# Patient Record
Sex: Female | Born: 1940 | Race: White | Hispanic: No | Smoking: Never smoker
Health system: Southern US, Community
[De-identification: ages and names within clinical notes are randomized; demographics above are authoritative.]

## PROBLEM LIST (undated history)

## (undated) DIAGNOSIS — R011 Cardiac murmur, unspecified: Secondary | ICD-10-CM

## (undated) DIAGNOSIS — E119 Type 2 diabetes mellitus without complications: Secondary | ICD-10-CM

## (undated) DIAGNOSIS — R0600 Dyspnea, unspecified: Secondary | ICD-10-CM

## (undated) DIAGNOSIS — I499 Cardiac arrhythmia, unspecified: Secondary | ICD-10-CM

## (undated) DIAGNOSIS — Z87442 Personal history of urinary calculi: Secondary | ICD-10-CM

## (undated) DIAGNOSIS — I10 Essential (primary) hypertension: Secondary | ICD-10-CM

## (undated) DIAGNOSIS — M1990 Unspecified osteoarthritis, unspecified site: Secondary | ICD-10-CM

## (undated) DIAGNOSIS — I4891 Unspecified atrial fibrillation: Secondary | ICD-10-CM

## (undated) DIAGNOSIS — I509 Heart failure, unspecified: Secondary | ICD-10-CM

## (undated) HISTORY — PX: EYE SURGERY: SHX253

## (undated) HISTORY — PX: MASTECTOMY: SHX3

## (undated) HISTORY — PX: ABDOMINAL HYSTERECTOMY: SHX81

## (undated) HISTORY — PX: TONSILLECTOMY: SUR1361

## (undated) HISTORY — PX: BREAST SURGERY: SHX581

---

## 2000-08-10 ENCOUNTER — Emergency Department (HOSPITAL_COMMUNITY): Admission: EM | Admit: 2000-08-10 | Discharge: 2000-08-10 | Payer: Self-pay | Admitting: Emergency Medicine

## 2019-11-21 ENCOUNTER — Observation Stay (HOSPITAL_COMMUNITY)
Admission: EM | Admit: 2019-11-21 | Discharge: 2019-11-22 | Disposition: A | Discharge status: Home / Self Care | Payer: Medicare PPO | Attending: Surgery | Admitting: Surgery

## 2019-11-21 ENCOUNTER — Emergency Department (HOSPITAL_COMMUNITY): Payer: Medicare PPO

## 2019-11-21 ENCOUNTER — Encounter (HOSPITAL_COMMUNITY): Payer: Self-pay

## 2019-11-21 DIAGNOSIS — Z79899 Other long term (current) drug therapy: Secondary | ICD-10-CM | POA: Insufficient documentation

## 2019-11-21 DIAGNOSIS — E119 Type 2 diabetes mellitus without complications: Secondary | ICD-10-CM | POA: Insufficient documentation

## 2019-11-21 DIAGNOSIS — S2232XA Fracture of one rib, left side, initial encounter for closed fracture: Secondary | ICD-10-CM | POA: Diagnosis not present

## 2019-11-21 DIAGNOSIS — S2222XA Fracture of body of sternum, initial encounter for closed fracture: Principal | ICD-10-CM | POA: Insufficient documentation

## 2019-11-21 DIAGNOSIS — S2249XA Multiple fractures of ribs, unspecified side, initial encounter for closed fracture: Secondary | ICD-10-CM

## 2019-11-21 DIAGNOSIS — R2681 Unsteadiness on feet: Secondary | ICD-10-CM | POA: Diagnosis not present

## 2019-11-21 DIAGNOSIS — I4891 Unspecified atrial fibrillation: Secondary | ICD-10-CM | POA: Diagnosis not present

## 2019-11-21 DIAGNOSIS — I509 Heart failure, unspecified: Secondary | ICD-10-CM | POA: Diagnosis not present

## 2019-11-21 DIAGNOSIS — Z7901 Long term (current) use of anticoagulants: Secondary | ICD-10-CM | POA: Diagnosis not present

## 2019-11-21 DIAGNOSIS — Z7984 Long term (current) use of oral hypoglycemic drugs: Secondary | ICD-10-CM | POA: Insufficient documentation

## 2019-11-21 DIAGNOSIS — S2220XA Unspecified fracture of sternum, initial encounter for closed fracture: Secondary | ICD-10-CM | POA: Diagnosis present

## 2019-11-21 DIAGNOSIS — I110 Hypertensive heart disease with heart failure: Secondary | ICD-10-CM | POA: Diagnosis not present

## 2019-11-21 DIAGNOSIS — Y92410 Unspecified street and highway as the place of occurrence of the external cause: Secondary | ICD-10-CM | POA: Insufficient documentation

## 2019-11-21 DIAGNOSIS — K449 Diaphragmatic hernia without obstruction or gangrene: Secondary | ICD-10-CM | POA: Diagnosis not present

## 2019-11-21 DIAGNOSIS — Z20822 Contact with and (suspected) exposure to covid-19: Secondary | ICD-10-CM | POA: Diagnosis not present

## 2019-11-21 HISTORY — DX: Type 2 diabetes mellitus without complications: E11.9

## 2019-11-21 HISTORY — DX: Personal history of urinary calculi: Z87.442

## 2019-11-21 HISTORY — DX: Heart failure, unspecified: I50.9

## 2019-11-21 HISTORY — DX: Unspecified osteoarthritis, unspecified site: M19.90

## 2019-11-21 HISTORY — DX: Dyspnea, unspecified: R06.00

## 2019-11-21 HISTORY — DX: Cardiac murmur, unspecified: R01.1

## 2019-11-21 HISTORY — DX: Essential (primary) hypertension: I10

## 2019-11-21 HISTORY — DX: Unspecified atrial fibrillation: I48.91

## 2019-11-21 HISTORY — DX: Cardiac arrhythmia, unspecified: I49.9

## 2019-11-21 LAB — CBC
HCT: 42 % (ref 36.0–46.0)
Hemoglobin: 13.5 g/dL (ref 12.0–15.0)
MCH: 30.6 pg (ref 26.0–34.0)
MCHC: 32.1 g/dL (ref 30.0–36.0)
MCV: 95.2 fL (ref 80.0–100.0)
Platelets: 213 10*3/uL (ref 150–400)
RBC: 4.41 MIL/uL (ref 3.87–5.11)
RDW: 13.4 % (ref 11.5–15.5)
WBC: 7.3 10*3/uL (ref 4.0–10.5)
nRBC: 0 % (ref 0.0–0.2)

## 2019-11-21 LAB — BASIC METABOLIC PANEL
Anion gap: 14 (ref 5–15)
BUN: 24 mg/dL — ABNORMAL HIGH (ref 8–23)
CO2: 24 mmol/L (ref 22–32)
Calcium: 9.6 mg/dL (ref 8.9–10.3)
Chloride: 100 mmol/L (ref 98–111)
Creatinine, Ser: 1.2 mg/dL — ABNORMAL HIGH (ref 0.44–1.00)
GFR calc Af Amer: 49 mL/min — ABNORMAL LOW (ref >60)
GFR calc non Af Amer: 43 mL/min — ABNORMAL LOW (ref >60)
Glucose, Bld: 148 mg/dL — ABNORMAL HIGH (ref 70–99)
Potassium: 4.1 mmol/L (ref 3.5–5.1)
Sodium: 138 mmol/L (ref 135–145)

## 2019-11-21 MED ORDER — IOHEXOL 300 MG/ML  SOLN
80.0000 mL | Freq: Once | INTRAMUSCULAR | Status: AC | PRN
  Administered 2019-11-21: 80 mL via INTRAVENOUS

## 2019-11-21 MED ORDER — FENTANYL CITRATE (PF) 100 MCG/2ML IJ SOLN
50.0000 ug | Freq: Once | INTRAMUSCULAR | Status: AC
  Administered 2019-11-21: 50 ug via INTRAVENOUS
  Filled 2019-11-21: qty 2

## 2019-11-21 NOTE — ED Notes (Signed)
Son Link Snuffer), would like to be called with any updates. Number in chart
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
## 2019-11-22 ENCOUNTER — Encounter (HOSPITAL_COMMUNITY): Payer: Self-pay

## 2019-11-22 ENCOUNTER — Other Ambulatory Visit: Payer: Self-pay

## 2019-11-22 DIAGNOSIS — S2220XA Unspecified fracture of sternum, initial encounter for closed fracture: Secondary | ICD-10-CM | POA: Diagnosis present

## 2019-11-22 LAB — GLUCOSE, CAPILLARY
Glucose-Capillary: 147 mg/dL — ABNORMAL HIGH (ref 70–99)
Glucose-Capillary: 147 mg/dL — ABNORMAL HIGH (ref 70–99)
Glucose-Capillary: 166 mg/dL — ABNORMAL HIGH (ref 70–99)

## 2019-11-22 LAB — BASIC METABOLIC PANEL
Anion gap: 9 (ref 5–15)
BUN: 21 mg/dL (ref 8–23)
CO2: 26 mmol/L (ref 22–32)
Calcium: 8.8 mg/dL — ABNORMAL LOW (ref 8.9–10.3)
Chloride: 101 mmol/L (ref 98–111)
Creatinine, Ser: 1.07 mg/dL — ABNORMAL HIGH (ref 0.44–1.00)
GFR calc Af Amer: 57 mL/min — ABNORMAL LOW (ref >60)
GFR calc non Af Amer: 49 mL/min — ABNORMAL LOW (ref >60)
Glucose, Bld: 187 mg/dL — ABNORMAL HIGH (ref 70–99)
Potassium: 3.9 mmol/L (ref 3.5–5.1)
Sodium: 136 mmol/L (ref 135–145)

## 2019-11-22 LAB — CBC
HCT: 36 % (ref 36.0–46.0)
Hemoglobin: 11.8 g/dL — ABNORMAL LOW (ref 12.0–15.0)
MCH: 31.1 pg (ref 26.0–34.0)
MCHC: 32.8 g/dL (ref 30.0–36.0)
MCV: 95 fL (ref 80.0–100.0)
Platelets: 191 10*3/uL (ref 150–400)
RBC: 3.79 MIL/uL — ABNORMAL LOW (ref 3.87–5.11)
RDW: 13.4 % (ref 11.5–15.5)
WBC: 9.3 10*3/uL (ref 4.0–10.5)
nRBC: 0 % (ref 0.0–0.2)

## 2019-11-22 LAB — SARS CORONAVIRUS 2 BY RT PCR (HOSPITAL ORDER, PERFORMED IN ~~LOC~~ HOSPITAL LAB): SARS Coronavirus 2: NEGATIVE

## 2019-11-22 LAB — URINALYSIS, ROUTINE W REFLEX MICROSCOPIC
Bilirubin Urine: NEGATIVE
Glucose, UA: 50 mg/dL — AB
Ketones, ur: 20 mg/dL — AB
Nitrite: NEGATIVE
Protein, ur: 100 mg/dL — AB
Specific Gravity, Urine: 1.03 (ref 1.005–1.030)
WBC, UA: >50 WBC/hpf — ABNORMAL HIGH (ref 0–5)
pH: 6 (ref 5.0–8.0)

## 2019-11-22 LAB — PROCALCITONIN: Procalcitonin: <0.1 ng/mL

## 2019-11-22 LAB — HEMOGLOBIN A1C
Hgb A1c MFr Bld: 6.6 % — ABNORMAL HIGH (ref 4.8–5.6)
Mean Plasma Glucose: 142.72 mg/dL

## 2019-11-22 LAB — MAGNESIUM: Magnesium: 1.7 mg/dL (ref 1.7–2.4)

## 2019-11-22 MED ORDER — LINAGLIPTIN 5 MG PO TABS
5.0000 mg | ORAL_TABLET | Freq: Every day | ORAL | Status: DC
  Administered 2019-11-22: 5 mg via ORAL
  Filled 2019-11-22: qty 1

## 2019-11-22 MED ORDER — MORPHINE SULFATE (PF) 2 MG/ML IV SOLN: 2.0000 mg | INTRAVENOUS | Status: DC | PRN

## 2019-11-22 MED ORDER — ACETAMINOPHEN 325 MG PO TABS: 650.0000 mg | ORAL_TABLET | ORAL | Status: AC | PRN

## 2019-11-22 MED ORDER — ONDANSETRON HCL 4 MG/2ML IJ SOLN: 4.0000 mg | Freq: Four times a day (QID) | INTRAMUSCULAR | Status: DC | PRN

## 2019-11-22 MED ORDER — ACETAMINOPHEN 325 MG PO TABS: 650.0000 mg | ORAL_TABLET | ORAL | Status: DC | PRN

## 2019-11-22 MED ORDER — DEXTROSE-NACL 5-0.45 % IV SOLN: INTRAVENOUS | Status: DC

## 2019-11-22 MED ORDER — METOPROLOL TARTRATE 5 MG/5ML IV SOLN: 5.0000 mg | Freq: Four times a day (QID) | INTRAVENOUS | Status: DC | PRN

## 2019-11-22 MED ORDER — OXYCODONE HCL 5 MG PO TABS: 5.0000 mg | ORAL_TABLET | ORAL | Status: DC | PRN

## 2019-11-22 MED ORDER — SOTALOL HCL 80 MG PO TABS
80.0000 mg | ORAL_TABLET | Freq: Two times a day (BID) | ORAL | Status: DC
  Administered 2019-11-22 (×2): 80 mg via ORAL
  Filled 2019-11-22 (×4): qty 1

## 2019-11-22 MED ORDER — AMLODIPINE BESYLATE 5 MG PO TABS
5.0000 mg | ORAL_TABLET | Freq: Every day | ORAL | Status: DC
  Administered 2019-11-22: 5 mg via ORAL
  Filled 2019-11-22: qty 1

## 2019-11-22 MED ORDER — INSULIN ASPART 100 UNIT/ML ~~LOC~~ SOLN
0.0000 [IU] | SUBCUTANEOUS | Status: DC
  Administered 2019-11-22 (×2): 2 [IU] via SUBCUTANEOUS
  Administered 2019-11-22: 3 [IU] via SUBCUTANEOUS

## 2019-11-22 MED ORDER — FUROSEMIDE 40 MG PO TABS: 40.0000 mg | ORAL_TABLET | Freq: Every day | ORAL | Status: DC | PRN

## 2019-11-22 MED ORDER — OXYCODONE HCL 5 MG PO TABS
5.0000 mg | ORAL_TABLET | ORAL | Status: DC | PRN
  Administered 2019-11-22: 5 mg via ORAL
  Filled 2019-11-22: qty 1

## 2019-11-22 MED ORDER — OXYCODONE HCL 5 MG PO TABS: 5.0000 mg | ORAL_TABLET | Freq: Four times a day (QID) | ORAL | 0 refills | Status: AC | PRN

## 2019-11-22 MED ORDER — ONDANSETRON 4 MG PO TBDP: 4.0000 mg | ORAL_TABLET | Freq: Four times a day (QID) | ORAL | Status: DC | PRN
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
## 2019-11-22 NOTE — Care Management Obs Status (Signed)
MEDICARE OBSERVATION STATUS NOTIFICATION   Patient Details  Name: Kayla Farrell MRN: [id number] Date of Birth: 02-Mar-1941   Medicare Observation Status Notification Given:  Yes    Glennon Mac, RN 11/22/2019, 4:14 PM

## 2019-11-22 NOTE — Progress Notes (Signed)
DC summary reviewed at bedside with patient and son.  Both verbalized understanding and declined further teaching.  Patient discharged from unit via wheelchair and staff supervision.  Renda Rolls RN  11/22/19 @ [phone]
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
## 2019-11-22 NOTE — Plan of Care (Signed)
  Problem: Education: Goal: Knowledge of General Education information will improve Description Including pain rating scale, medication(s)/side effects and non-pharmacologic comfort measures Outcome: Progressing   
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
## 2019-11-24 LAB — URINE CULTURE: Culture: >=100000 — AB
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
## 2024-01-15 NOTE — Progress Notes (Signed)
 [address] - AMBULATORY [facility]  - [facility] [address] [place] [address]    Date of Service: 01/15/2024 Patient Name: Kayla Farrell Patient DOB: 07-Jan-1941    Assessment and Plan:  Zahli was seen today for hospital follow up.  Diagnoses and all orders for this visit:  Fall from standing, initial encounter Discussed fall prevention and she is more cautious at her house now.  Discussed using a cane and she will try to pick 1 up.  Right-sided low back pain without sciatica, unspecified chronicity Feeling some better still soreness.  Will send steroid pack to help with her symptoms.  CT secondary to prolonged pain and ruling out fracture. -     methylPREDNISolone (MEDROL DOSEPAK) 4 mg 6 day dose pack; Take As Directed On Package -     CT Spine Lumbar WO Contrast; Future    She verbalizes understanding and agreement of her current diagnoses, medications and therapies. All questions answered.   Medication side effects discussed with patient. Advised patient to call clinic or return for visit if these symptoms occur. The patient does not  have any barriers to taking medications as prescribed. Goals of care discussed with patient including medication adherence and adequate follow up Relevant barriers identified and addressed: none.   Results for orders placed or performed in visit on 11/17/23  Hemoglobin A1C With Estimated Average Glucose   Collection Time: 11/17/23  3:56 PM  Result Value Ref Range   Hemoglobin A1c 6.0 (H) <5.7 %   Estimated Average Glucose 126 mg/dL  TSH   Collection Time: 11/17/23  3:56 PM  Result Value Ref Range   TSH 1.914 0.450 - 5.330 uIU/mL  Albumin, Random Urine   Collection Time: 11/17/23  3:56 PM  Result Value Ref Range   Albumin, Urine 403 Not Established mg/L   Creatinine, Urine 159 >=20 mg/dL   Albumin/Creatinine Ratio, Urine 253 (H) 0 - 30 mg/g creat  CBC with  Differential   Collection Time: 11/17/23  3:56 PM  Result Value Ref Range   WBC 5.70 4.40 - 11.00 10*3/uL   RBC 3.77 (L) 4.10 - 5.10 10*6/uL   Hemoglobin 11.8 (L) 12.3 - 15.3 g/dL   Hematocrit 64.0 64.0 - 44.6 %   Mean Corpuscular Volume (MCV) 95.0 80.0 - 96.0 fL   Mean Corpuscular Hemoglobin (MCH) 31.2 27.5 - 33.2 pg   Mean Corpuscular Hemoglobin Conc (MCHC) 32.9 (L) 33.0 - 37.0 g/dL   Red Cell Distribution Width (RDW) 16.4 12.3 - 17.0 %   Platelet Count (PLT) 149 (L) 150 - 450 10*3/uL   Mean Platelet Volume (MPV) 10.2 6.8 - 10.2 fL   Neutrophils % 79 %   Lymphocytes % 9 %   Monocytes % 9 %   Eosinophils % 2 %   Basophils % 1 %   nRBC % 0 %   Neutrophils Absolute 4.50 1.80 - 7.80 10*3/uL   Lymphocytes # 0.50 (L) 1.00 - 4.80 10*3/uL   Monocytes # 0.50 0.00 - 0.80 10*3/uL   Eosinophils # 0.10 0.00 - 0.50 10*3/uL   Basophils # 0.00 0.00 - 0.20 10*3/uL   nRBC Absolute 0.00 <=0.00 10*3/uL   Return in about 3 weeks (around 02/05/2024) for fall follow up. Electronically signed by: Elsie Fairy Favorite, PA-C 01/15/2024 4:57 PM   HPI:   Kayla Farrell is a 83 y.o. female presents with hospital follow up (Pt was in the shower,  when she got out she felt tired and was headed to the bed to rest and the kind of woke up sitting in the floor . Low back pain, went to ER and had xrays which her negative. )    ROS:  Constitutional symptoms: negative Eyes:  negative Ear, nose, throat:  negative Cardiovascular:  negative Respiratory:  negative Gastrointestinal:  negative Genitourinary:  negative Skin:  negative Neurological:  negative Musculoskeletal:  negative Psychiatric:  negative Endocrine:  negative Hematological:  negative Allergic:  negative  Objective:   Vitals:   01/15/24 1601 01/15/24 1609  BP: 149/83 149/66  Pulse: 75 81  Resp: 14   Temp: 97.9 F (36.6 C)   TempSrc: Temporal   SpO2: 100% 100%  Weight: 63 kg (138 lb 12.8 oz)   Height: 1.676 m (5' 6)      Constitutional: Well-developed and well-nourished. Sitting comfortably conversing normally. Pleasant.  Cardiovascular: +S1S2, regular rate and rhythm, no murmurs, gallops or rubs appreciated.  Respiratory: Normal effort, clear to auscultation bilaterally. No wheezes, rales or rhonchi noted.  Back: No CVA tenderness bilaterally.  Mild tenderness palpation over the low back, midline Neuro: Alert and oriented x 3. Cranial nerves II-XII grossly intact.  Gait is guarded and slow  Allergies Meperidine, Cephalexin, Codeine, Latex, Nickel, Nitrofurantoin macrocrystalline, Sulfamethoxazole, and Trimethoprim  Current Medications[1] Problem List[2] Surgical History[3] Family History[4] Social History[5] Tobacco Use History[6]   I have reviewed and (if needed) updated the patient's problem list, medications, allergies, past medical and surgical history, social and family history. Health Maintenance Status       Date Due Completion Dates   Bone Density Scan Never done ---   Pneumococcal Vaccine for Ages 69+ (1 of 2 - PCV) Never done ---   ZOSTER VACCINE (2 of 2) 08/21/2023 06/26/2023   Influenza Vaccine (1) 12/18/2023 ---   Adult RSV (60+ Years or Pregnancy) (1 - 1-dose 75+ series) 11/16/2024 (Originally 12/24/2013) ---   DTaP/Tdap/Td Vaccines (1 - Tdap) 11/16/2024 (Originally 12/24/1959) ---   COVID-19 Vaccine (3 - 2024-25 season) 11/16/2024 (Originally 01/18/2023) 06/28/2019, 06/07/2019   Diabetes:  eGFR for Kidney Evaluation 11/15/2024 11/16/2023, 07/20/2023   Diabetes:  Quantitative uACR for Kidney Evaluation 11/16/2024 11/17/2023   Comprehensive Annual Visit 11/16/2024 11/17/2023, 11/17/2023   Medicare Annual Wellness (AWV) Subsequent Visits 11/16/2024 11/17/2023, 11/17/2023   Diabetes: Foot Exam 11/16/2024 11/17/2023, 11/13/2022   Comment on 02/12/2021: See [facility]   Diabetes: Hemoglobin A1C 11/16/2024 11/17/2023, 11/17/2023   Depression Screening 11/24/2024 11/25/2023, 11/17/2023       This document serves as  a record of services personally performed by Elsie Favorite PA-C,  It was created on their behalf by Katheryn Gentry, RMA, a trained medical scribe, and Certified Medical Assistant (CMA). During the course of documenting the history, physical exam and medical decision making, I was functioning as a Stage manager. The creation of this record is the provider's dictation and/or activities during the visit.  Electronically signed by Katheryn Gentry, RMA 01/15/2024 4:00 PM     I, Elsie Favorite, PA-C, have reviewed the scribe's documentation, personally examined the patient, added my documentation and attest it is accurate and complete.       [1] Current Outpatient Medications  Medication Sig Dispense Refill  . atorvastatin (LIPITOR) 10 mg tablet Take 10 mg by mouth 2 (two) times weekly. 90 tablet 1  . calcium carbonate (OS-CAL) 1250 mg (500 mg calcium) tablet Take 1 tablet (1,250 mg total) by mouth daily. Any over-the-counter calcium supplement is  reasonable.    . cyanocobalamin (VITAMIN B12) 1,000 mcg tablet Take 1,000 mcg by mouth Once Daily.    . Eliquis 2.5 mg tab TAKE 1 TABLET(2.5 MG) BY MOUTH TWICE DAILY 180 tablet 1  . Entresto 24-26 mg per tablet TAKE 1 TABLET BY MOUTH TWICE DAILY 180 tablet 1  . ergocalciferol (VITAMIN D2) 1,250 mcg (50,000 unit) capsule TAKE 1 CAPSULE BY MOUTH 1 TIME A WEEK 12 capsule 0  . furosemide  (LASIX ) 20 mg tablet Take 1 tablet (20 mg total) by mouth daily. Take 2 tablets (40 mg total) if you gain more than 3 pounds in a day and call your doctor. (Patient taking differently: Take 20 mg by mouth as needed.) 30 tablet 2  . Januvia 50 mg tablet TAKE 1 TABLET(50 MG) BY MOUTH DAILY 90 tablet 1  . labetaloL (NORMODYNE) 200 mg tablet TAKE 1 TABLET(200 MG) BY MOUTH TWICE DAILY 180 tablet 0  . methylPREDNISolone (MEDROL DOSEPAK) 4 mg 6 day dose pack Take As Directed On Package 21 tablet 0   No current facility-administered medications for this visit.  [2] Patient Active Problem  List Diagnosis  . Nuclear sclerosis  . Pseudophakia of both eyes  . Dermatochalasis of both upper eyelids  . Pseudoptosis of right eye  . Pseudoptosis of left eye  . Atrial fibrillation (HCC)  . Essential hypertension  . Congestive cardiomyopathy (HCC)  . Hypertensive heart disease with heart failure (HCC)  . Type 2 diabetes mellitus with hyperglycemia (HCC)  . Ulnar neuropathy at elbow, right  . Ulnar neuropathy at wrist, right  . DDD (degenerative disc disease), cervical  . Cervical spondylosis  . Primary osteoarthritis involving multiple joints  . Perennial allergic rhinitis with seasonal variation  . Mixed hyperlipidemia  . Type 2 diabetes mellitus with both eyes affected by mild nonproliferative retinopathy without macular edema, without long-term current use of insulin     (CMD)  . Long term current use of anticoagulant  . Decreased functional mobility and endurance  . Type 2 diabetes mellitus with circulatory disorder, without long-term current use of insulin  (HCC)  . Closed displaced fracture of lateral malleolus of right fibula with routine healing  . Type 2 diabetes mellitus with stage 3b chronic kidney disease    (CMD)  . Diarrhea of infectious origin  . Clostridium difficile infection  . Chronic kidney disease, stage 3b (CMD)  . Hypomagnesemia  . Generalized muscle weakness  . Impaired mobility and ADLs  [3] Past Surgical History: Procedure Laterality Date  . BLEPHAROPLASTY Bilateral 10/26/2015   Procedure: BLEPHAROPLASTY UPPER LIDS;  Surgeon: Lamar Belvie Seashore, MD;  Location: [facility] OUTPATIENT OR;  Service: Ophthalmology;  Laterality: Bilateral;  no asa for 1 week or fish oils, ask permission from PCP; waiver  . BREAST LUMPECTOMY Bilateral May 1985   Procedure: BREAST LUMPECTOMY  . CATARACT EXTRACTION W/  INTRAOCULAR LENS IMPLANT Left 09/2012   Procedure: CATARACT EXTRACTION W/  INTRAOCULAR LENS IMPLANT; [facility]  . CATARACT EXTRACTION W/  INTRAOCULAR LENS IMPLANT  Right 10-28-12   Procedure: CATARACT EXTRACTION W/  INTRAOCULAR LENS IMPLANT; [facility]  . CYSTOSCOPY W/ STONE MANIPULATION  2006-2010   Procedure: CYSTOSCOPY W/ BLADDER STONE REMOVAL; [place]  . LITHOTRIPSY  2007-2011,  Feb 2015   Procedure: LITHOTRIPSY  . TAH-BSO (TOTAL ABDOMINAL HYSTERECTOMY W/ BILATERAL SALPINGOOPHORECTOMY  1984   Procedure: TOTAL ABDOMINAL HYSTERECTOMY W/ BILATERAL SALPINGOOPHORECTOMY  . TONSILLECTOMY AND ADENOIDECTOMY  1960   Procedure: TONSILLECTOMY AND ADENOIDECTOMY  [4] Family History Problem Relation Name  Age of Onset  . Cataracts Mother    . Diabetes Mother    . Hypertension Mother    . Stroke Mother    . Cataracts Sister    . Hypertension Sister    . Stroke Sister    . Thyroid disease Sister    . Heart disease Sister    . Cancer Brother    . Heart disease Brother    . Hypertension Father    . Stroke Father    . Amblyopia Neg Hx    . Blindness Neg Hx    . Glaucoma Neg Hx    . Macular degeneration Neg Hx    . Retinal detachment Neg Hx    . Strabismus Neg Hx    [5] Social History Socioeconomic History  . Marital status: Divorced  Tobacco Use  . Smoking status: Never  . Smokeless tobacco: Never  Substance and Sexual Activity  . Alcohol use: No  . Drug use: Never  . Sexual activity: Not Currently  Social History Narrative   Lives alone.  Retired Surveyor, mining.   Social Drivers of Health   Food Insecurity: Low Risk  (04/26/2023)   Food vital sign   . Within the past 12 months, you worried that your food would run out before you got money to buy more: Never true   . Within the past 12 months, the food you bought just didn't last and you didn't have money to get more: Never true  Transportation Needs: No Transportation Needs (04/26/2023)   Transportation   . In the past 12 months, has lack of reliable transportation kept you from medical appointments, meetings, work or from getting things needed for  daily living? : No  Safety: Low Risk  (04/26/2023)   Safety   . How often does anyone, including family and friends, physically hurt you?: Never   . How often does anyone, including family and friends, insult or talk down to you?: Never   . How often does anyone, including family and friends, threaten you with harm?: Never   . How often does anyone, including family and friends, scream or curse at you?: Never  Living Situation: Low Risk  (04/26/2023)   Living Situation   . What is your living situation today?: I have a steady place to live   . Think about the place you live. Do you have problems with any of the following? Choose all that apply:: None/None on this list  [6] Social History Tobacco Use  Smoking Status Never  Smokeless Tobacco Never
